# Patient Record
Sex: Male | Born: 1975 | Race: Black or African American | Hispanic: No | Smoking: Current every day smoker
Health system: Southern US, Community
[De-identification: ages and names within clinical notes are randomized; demographics above are authoritative.]

---

## 2017-02-15 ENCOUNTER — Emergency Department (HOSPITAL_COMMUNITY): Payer: Self-pay

## 2017-02-15 ENCOUNTER — Encounter (HOSPITAL_COMMUNITY): Payer: Self-pay | Admitting: Nurse Practitioner

## 2017-02-15 ENCOUNTER — Emergency Department (HOSPITAL_COMMUNITY)
Admission: EM | Admit: 2017-02-15 | Discharge: 2017-02-15 | Disposition: A | Discharge status: Home / Self Care | Payer: Self-pay | Attending: Emergency Medicine | Admitting: Emergency Medicine

## 2017-02-15 ENCOUNTER — Other Ambulatory Visit: Payer: Self-pay

## 2017-02-15 DIAGNOSIS — F17200 Nicotine dependence, unspecified, uncomplicated: Secondary | ICD-10-CM | POA: Insufficient documentation

## 2017-02-15 DIAGNOSIS — K029 Dental caries, unspecified: Secondary | ICD-10-CM | POA: Insufficient documentation

## 2017-02-15 DIAGNOSIS — R072 Precordial pain: Secondary | ICD-10-CM | POA: Insufficient documentation

## 2017-02-15 DIAGNOSIS — R05 Cough: Secondary | ICD-10-CM | POA: Insufficient documentation

## 2017-02-15 DIAGNOSIS — R55 Syncope and collapse: Secondary | ICD-10-CM | POA: Insufficient documentation

## 2017-02-15 DIAGNOSIS — R059 Cough, unspecified: Secondary | ICD-10-CM

## 2017-02-15 DIAGNOSIS — Z7982 Long term (current) use of aspirin: Secondary | ICD-10-CM | POA: Insufficient documentation

## 2017-02-15 LAB — COMPREHENSIVE METABOLIC PANEL
ALT: 52 U/L (ref 17–63)
ANION GAP: 7 (ref 5–15)
AST: 43 U/L — ABNORMAL HIGH (ref 15–41)
Albumin: 3.8 g/dL (ref 3.5–5.0)
Alkaline Phosphatase: 76 U/L (ref 38–126)
BUN: 18 mg/dL (ref 6–20)
CHLORIDE: 108 mmol/L (ref 101–111)
CO2: 25 mmol/L (ref 22–32)
Calcium: 8.9 mg/dL (ref 8.9–10.3)
Creatinine, Ser: 1.12 mg/dL (ref 0.61–1.24)
GFR calc Af Amer: >60 mL/min (ref >60)
GFR calc non Af Amer: >60 mL/min (ref >60)
Glucose, Bld: 96 mg/dL (ref 65–99)
POTASSIUM: 3.8 mmol/L (ref 3.5–5.1)
SODIUM: 140 mmol/L (ref 135–145)
Total Bilirubin: 0.3 mg/dL (ref 0.3–1.2)
Total Protein: 6.6 g/dL (ref 6.5–8.1)

## 2017-02-15 LAB — URINALYSIS, ROUTINE W REFLEX MICROSCOPIC
Bilirubin Urine: NEGATIVE
Glucose, UA: NEGATIVE mg/dL
Hgb urine dipstick: NEGATIVE
KETONES UR: NEGATIVE mg/dL
LEUKOCYTES UA: NEGATIVE
Nitrite: NEGATIVE
PH: 7 (ref 5.0–8.0)
Protein, ur: NEGATIVE mg/dL
SPECIFIC GRAVITY, URINE: 1.004 — AB (ref 1.005–1.030)

## 2017-02-15 LAB — CBC WITH DIFFERENTIAL/PLATELET
BASOS PCT: 0 %
Basophils Absolute: 0 10*3/uL (ref 0.0–0.1)
EOS ABS: 0.1 10*3/uL (ref 0.0–0.7)
EOS PCT: 1 %
HCT: 38.3 % — ABNORMAL LOW (ref 39.0–52.0)
Hemoglobin: 12.7 g/dL — ABNORMAL LOW (ref 13.0–17.0)
Lymphocytes Relative: 38 %
Lymphs Abs: 2.8 10*3/uL (ref 0.7–4.0)
MCH: 32.2 pg (ref 26.0–34.0)
MCHC: 33.2 g/dL (ref 30.0–36.0)
MCV: 97 fL (ref 78.0–100.0)
MONOS PCT: 7 %
Monocytes Absolute: 0.6 10*3/uL (ref 0.1–1.0)
Neutro Abs: 4 10*3/uL (ref 1.7–7.7)
Neutrophils Relative %: 53 %
PLATELETS: 95 10*3/uL — AB (ref 150–400)
RBC: 3.95 MIL/uL — ABNORMAL LOW (ref 4.22–5.81)
RDW: 14 % (ref 11.5–15.5)
WBC: 7.5 10*3/uL (ref 4.0–10.5)

## 2017-02-15 LAB — POCT I-STAT TROPONIN I: Troponin i, poc: 0 ng/mL (ref 0.00–0.08)

## 2017-02-15 MED ORDER — BUPIVACAINE-EPINEPHRINE (PF) 0.5% -1:200000 IJ SOLN
1.8000 mL | Freq: Once | INTRAMUSCULAR | Status: DC
  Filled 2017-02-15: qty 1.8

## 2017-02-15 MED ORDER — PENICILLIN V POTASSIUM 500 MG PO TABS: 1000.0000 mg | ORAL_TABLET | Freq: Two times a day (BID) | ORAL | 0 refills | Status: DC

## 2017-02-15 MED ORDER — SODIUM CHLORIDE 0.9 % IV BOLUS (SEPSIS)
1000.0000 mL | Freq: Once | INTRAVENOUS | Status: AC
  Administered 2017-02-15: 1000 mL via INTRAVENOUS

## 2017-02-15 MED ORDER — IBUPROFEN 800 MG PO TABS: 800.0000 mg | ORAL_TABLET | Freq: Three times a day (TID) | ORAL | 0 refills | Status: DC

## 2017-02-15 NOTE — Discharge Instructions (Signed)
1. Medications: ibuprofen, penicillin, usual home medications °2. Treatment: rest, drink plenty of fluids, take medications as prescribed °3. Follow Up: Please followup with dentistry within 1 week for discussion of your diagnoses and further evaluation after today's visit; if you do not have a primary care doctor use the resource guide provided to find one; Return to the ER for high fevers, difficulty breathing, difficulty swallowing or other concerning symptoms ° °

## 2017-02-15 NOTE — ED Provider Notes (Signed)
[facility]-EMERGENCY DEPT Provider Note   CSN: [id number] Arrival date & time: 02/15/17  0226     History   Chief Complaint Chief Complaint  Patient presents with  . Cough  . Dental Pain    HPI Willie Walter is a 41 y.o. male with a hx of chronic dental pain presents to the Emergency Department with multiple complaints.    Pt reports pacing in the kitchen last night and experienced an LOC.  Reports some prodrome. He reports concern of dehydration due to decreased PO intake.  He does not think he hit his head. NO numbness, tingling, weakness, gait disturbance afterwards.    Pt c/o chronic dental pain and no dental care.  Pt reports pain in the bottom left molar (present for years) and left upper molar.  Pt reports generalized throbbing headache due to dental pain.  He reports loss of sleep.  He has used tylenol and Orajel without relief.  He reports pressure in his frontal sinuses.    Pt also with cough productive of grey sputum and with associated chest pain during coughing.  Denies fever, wheezing, SOB.  Pt reports he is an every day smoker.     HPI  History reviewed. No pertinent past medical history.  There are no active problems to display for this patient.   History reviewed. No pertinent surgical history.     Home Medications    Prior to Admission medications   Medication Sig Start Date End Date Taking? Authorizing Provider  acetaminophen (TYLENOL) 650 MG CR tablet Take 1,950 mg by mouth every 8 (eight) hours as needed for pain.   Yes [provider]  benzocaine (ORAJEL) 10 % mucosal gel Use as directed 1 application in the mouth or throat 4 (four) times daily as needed (for tooth pain).   Yes [provider]  ibuprofen (ADVIL,MOTRIN) 800 MG tablet Take 1 tablet (800 mg total) by mouth 3 (three) times daily. 02/15/17   Christipher Rieger, Dahlia Client, PA-C  penicillin v potassium (VEETID) 500 MG tablet Take 2 tablets (1,000 mg total) by mouth 2 (two) times daily. X 7  days 02/15/17   Tarris Delbene, Dahlia Client, PA-C    Family History History reviewed. No pertinent family history.  Social History Social History  Substance Use Topics  . Smoking status: Current Every Day Smoker  . Smokeless tobacco: Never Used  . Alcohol use Yes     Allergies   Patient has no known allergies.   Review of Systems Review of Systems  Constitutional: Negative for appetite change, diaphoresis, fatigue, fever and unexpected weight change.  HENT: Positive for dental problem. Negative for facial swelling and mouth sores.   Eyes: Negative for visual disturbance.  Respiratory: Negative for cough, chest tightness, shortness of breath and wheezing.   Cardiovascular: Negative for chest pain.  Gastrointestinal: Negative for abdominal pain, constipation, diarrhea, nausea and vomiting.  Endocrine: Negative for polydipsia, polyphagia and polyuria.  Genitourinary: Negative for dysuria, frequency, hematuria and urgency.  Musculoskeletal: Negative for back pain and neck stiffness.  Skin: Negative for rash.  Allergic/Immunologic: Negative for immunocompromised state.  Neurological: Positive for syncope. Negative for light-headedness and headaches.  Hematological: Does not bruise/bleed easily.  Psychiatric/Behavioral: Negative for sleep disturbance. The patient is not nervous/anxious.      Physical Exam Updated Vital Signs BP (!) 131/94 (BP Location: Right Arm)   Pulse 61   Temp 98.5 F (36.9 C) (Oral)   Resp 12   SpO2 97%   Physical Exam  Constitutional: He is  oriented to person, place, and time. He appears well-developed and well-nourished. No distress.  HENT:  Head: Normocephalic and atraumatic.  Right Ear: Tympanic membrane, external ear and ear canal normal.  Left Ear: Tympanic membrane, external ear and ear canal normal.  Nose: Nose normal. Right sinus exhibits no maxillary sinus tenderness and no frontal sinus tenderness. Left sinus exhibits no maxillary sinus  tenderness and no frontal sinus tenderness.  Mouth/Throat: Uvula is midline, oropharynx is clear and moist and mucous membranes are normal. No oral lesions. Abnormal dentition. Dental caries present. No uvula swelling or lacerations. No oropharyngeal exudate, posterior oropharyngeal edema, posterior oropharyngeal erythema or tonsillar abscesses.  Large dental caries to the right upper second molar and left lower first molar No gross abscess No fluctuance or induration to the buccal mucosa or floor of the mouth  Eyes: Conjunctivae and EOM are normal. Pupils are equal, round, and reactive to light. Right eye exhibits no discharge. Left eye exhibits no discharge. No scleral icterus.  No horizontal, vertical or rotational nystagmus  Neck: Normal range of motion. Neck supple.  No stridor Handling secretions without difficulty No nuchal rigidity No cervical lymphadenopathy  Cardiovascular: Normal rate, regular rhythm, normal heart sounds and intact distal pulses.   Pulmonary/Chest: Effort normal and breath sounds normal. No respiratory distress. He has no wheezes. He has no rales.  Equal chest rise  Abdominal: Soft. Bowel sounds are normal. He exhibits no distension. There is no tenderness. There is no rebound and no guarding.  Musculoskeletal: Normal range of motion.  Lymphadenopathy:    He has no cervical adenopathy.  Neurological: He is alert and oriented to person, place, and time. No cranial nerve deficit. He exhibits normal muscle tone. Coordination normal.  Mental Status:  Alert, oriented, thought content appropriate. Speech fluent without evidence of aphasia. Able to follow 2 step commands without difficulty.  Cranial Nerves:  II:  Peripheral visual fields grossly normal, pupils equal, round, reactive to light III,IV, VI: ptosis not present, extra-ocular motions intact bilaterally  V,VII: smile symmetric, facial light touch sensation equal VIII: hearing grossly normal bilaterally  IX,X:  midline uvula rise  XI: bilateral shoulder shrug equal and strong XII: midline tongue extension  Motor:  5/5 in upper and lower extremities bilaterally including strong and equal grip strength and dorsiflexion/plantar flexion Sensory: Pinprick and light touch normal in all extremities.  Cerebellar: normal finger-to-nose with bilateral upper extremities Gait: normal gait and balance CV: distal pulses palpable throughout   Skin: Skin is warm and dry. No rash noted. He is not diaphoretic.  Psychiatric: He has a normal mood and affect. His behavior is normal. Judgment and thought content normal.  Nursing note and vitals reviewed.    ED Treatments / Results  Labs (all labs ordered are listed, but only abnormal results are displayed) Labs Reviewed  CBC WITH DIFFERENTIAL/PLATELET - Abnormal; Notable for the following:       Result Value   RBC 3.95 (*)    Hemoglobin 12.7 (*)    HCT 38.3 (*)    Platelets 95 (*)    All other components within normal limits  COMPREHENSIVE METABOLIC PANEL - Abnormal; Notable for the following:    AST 43 (*)    All other components within normal limits  URINALYSIS, ROUTINE W REFLEX MICROSCOPIC - Abnormal; Notable for the following:    Color, Urine COLORLESS (*)    Specific Gravity, Urine 1.004 (*)    All other components within normal limits  I-STAT TROPOININ, ED  POCT I-STAT TROPONIN I    EKG  EKG Interpretation  Date/Time:  Saturday February 15 2017 05:12:36 EDT Ventricular Rate:  55 PR Interval:    QRS Duration: 85 QT Interval:  441 QTC Calculation: 422 R Axis:   89 Text Interpretation:  Sinus rhythm ST elev, probable normal early repol pattern No acute changes No old tracing to compare Confirmed by Derwood Kaplan [phone]) on 02/15/2017 7:04:58 AM       Radiology Dg Chest 2 View  Result Date: 02/15/2017 CLINICAL DATA:  41 year old male with cough and chest pain. EXAM: CHEST  2 VIEW COMPARISON:  None. FINDINGS: The heart size and mediastinal  contours are within normal limits. Both lungs are clear. The visualized skeletal structures are unremarkable. IMPRESSION: No active cardiopulmonary disease. Electronically Signed   By: Elgie Collard M.D.   On: 02/15/2017 03:56   Ct Head Wo Contrast  Result Date: 02/15/2017 CLINICAL DATA:  Severe dental pain, chest pain and cough.  Syncope. EXAM: CT HEAD WITHOUT CONTRAST TECHNIQUE: Contiguous axial images were obtained from the base of the skull through the vertex without intravenous contrast. COMPARISON:  None. FINDINGS: BRAIN: No intraparenchymal hemorrhage, mass effect nor midline shift. The ventricles and sulci are normal. No acute large vascular territory infarcts. No abnormal extra-axial fluid collections. Basal cisterns are patent. VASCULAR: Unremarkable. SKULL/SOFT TISSUES: No skull fracture. No significant soft tissue swelling. ORBITS/SINUSES: The included ocular globes and orbital contents are nonsuspicious. Old LEFT medial orbital blowout fracture.Mild paranasal sinus mucosal thickening. Soft tissue within the external auditory canals compatible with cerumen. OTHER: None. IMPRESSION: Negative noncontrast CT HEAD. Electronically Signed   By: Awilda Metro M.D.   On: 02/15/2017 05:01    Procedures Dental Block Date/Time: 02/15/2017 6:48 AM Performed by: Dierdre Forth Authorized by: Dierdre Forth   Consent:    Consent obtained:  Verbal   Consent given by:  Patient   Risks discussed:  Infection, hematoma, intravascular injection and pain   Alternatives discussed:  No treatment Indications:    Indications: dental pain   Location:    Block type:  Supraperiosteal   Supraperiosteal location:  Upper teeth and lower teeth   Upper teeth location:  2/RU 2nd molar   Lower teeth location:  19/LL 1st molar Procedure details (see MAR for exact dosages):    Syringe type:  Aspirating dental syringe   Needle gauge:  27 G   Anesthetic injected:  Bupivacaine 0.5% WITH epi    Injection procedure:  Anatomic landmarks identified, introduced needle, incremental injection, anatomic landmarks palpated and negative aspiration for blood Post-procedure details:    Outcome:  Anesthesia achieved   Patient tolerance of procedure:  Tolerated well, no immediate complications   (including critical care time)  Medications Ordered in ED Medications  bupivacaine-epinephrine (MARCAINE W/ EPI) 0.5% -1:200000 injection 1.8 mL (not administered)  bupivacaine-epinephrine (MARCAINE W/ EPI) 0.5% -1:200000 injection 1.8 mL (not administered)  sodium chloride 0.9 % bolus 1,000 mL (1,000 mLs Intravenous New Bag/Given 02/15/17 0504)     Initial Impression / Assessment and Plan / ED Course  I have reviewed the triage vital signs and the nursing notes.  Pertinent labs & imaging results that were available during my care of the patient were reviewed by me and considered in my medical decision making (see chart for details).     Patient presents with persistent dental pain.  No gross abscess.  Exam unconcerning for Ludwig's angina or spread of infection.  Will treat with penicillin and anti-inflammatory medicine.  Digital block given with total pain relief. Urged patient to follow-up with dentist.  Patient given dental resources.  Patient also with complaints of syncopal episode. No evidence of orthostasis here. Mild anemia patient without chest pain or shortness of breath. CT head without acute abnormality. Negative troponin.  Patient with complaints of chest pain with cough. Lungs are clear and equal. Chest x-rays without evidence of pneumonia. Patient is afebrile. Troponin is negative and EKG is reassuring.  Patient does not have a persistent or constant chest pain. No diaphoresis, nausea or vomiting.      Final Clinical Impressions(s) / ED Diagnoses   Final diagnoses:  Pain due to dental caries  Syncope, unspecified syncope type  Cough  Precordial pain    New  Prescriptions New Prescriptions   IBUPROFEN (ADVIL,MOTRIN) 800 MG TABLET    Take 1 tablet (800 mg total) by mouth 3 (three) times daily.   PENICILLIN V POTASSIUM (VEETID) 500 MG TABLET    Take 2 tablets (1,000 mg total) by mouth 2 (two) times daily. X 7 days     Milta DeitersMuthersbaugh, Deriana Vanderhoef, PA-C 02/15/17 [id number]    Ara Grandmaison, Dahlia ClientHannah, PA-C 02/15/17 [id number]    Derwood KaplanNanavati, Ankit, MD 02/15/17 [id number]

## 2017-02-15 NOTE — ED Triage Notes (Signed)
Pt is c/o severe dental pain bilateral jaws, chest pain and also coughing.

## 2017-02-20 ENCOUNTER — Emergency Department (HOSPITAL_COMMUNITY): Admission: EM | Admit: 2017-02-20 | Discharge: 2017-02-20 | Discharge status: Absent without leave | Payer: Self-pay

## 2017-06-24 ENCOUNTER — Encounter (HOSPITAL_COMMUNITY): Payer: Self-pay | Admitting: *Deleted

## 2017-06-24 ENCOUNTER — Emergency Department (HOSPITAL_COMMUNITY)
Admission: EM | Admit: 2017-06-24 | Discharge: 2017-06-24 | Disposition: A | Discharge status: Home / Self Care | Payer: Self-pay | Attending: Emergency Medicine | Admitting: Emergency Medicine

## 2017-06-24 DIAGNOSIS — K0889 Other specified disorders of teeth and supporting structures: Secondary | ICD-10-CM | POA: Insufficient documentation

## 2017-06-24 DIAGNOSIS — F17200 Nicotine dependence, unspecified, uncomplicated: Secondary | ICD-10-CM | POA: Insufficient documentation

## 2017-06-24 DIAGNOSIS — Z791 Long term (current) use of non-steroidal anti-inflammatories (NSAID): Secondary | ICD-10-CM | POA: Insufficient documentation

## 2017-06-24 DIAGNOSIS — Z202 Contact with and (suspected) exposure to infections with a predominantly sexual mode of transmission: Secondary | ICD-10-CM | POA: Insufficient documentation

## 2017-06-24 LAB — URINALYSIS, ROUTINE W REFLEX MICROSCOPIC
BILIRUBIN URINE: NEGATIVE
GLUCOSE, UA: NEGATIVE mg/dL
HGB URINE DIPSTICK: NEGATIVE
Ketones, ur: NEGATIVE mg/dL
Leukocytes, UA: NEGATIVE
Nitrite: NEGATIVE
Protein, ur: NEGATIVE mg/dL
SPECIFIC GRAVITY, URINE: 1.002 — AB (ref 1.005–1.030)
pH: 6 (ref 5.0–8.0)

## 2017-06-24 MED ORDER — PENICILLIN V POTASSIUM 500 MG PO TABS: 500.0000 mg | ORAL_TABLET | Freq: Four times a day (QID) | ORAL | 0 refills | Status: AC

## 2017-06-24 MED ORDER — AZITHROMYCIN 250 MG PO TABS
1000.0000 mg | ORAL_TABLET | Freq: Once | ORAL | Status: AC
  Administered 2017-06-24: 1000 mg via ORAL
  Filled 2017-06-24: qty 4

## 2017-06-24 MED ORDER — ONDANSETRON 4 MG PO TBDP
8.0000 mg | ORAL_TABLET | Freq: Once | ORAL | Status: AC
  Administered 2017-06-24: 8 mg via ORAL
  Filled 2017-06-24: qty 2

## 2017-06-24 MED ORDER — LIDOCAINE HCL (PF) 1 % IJ SOLN
INTRAMUSCULAR | Status: AC
  Filled 2017-06-24: qty 5

## 2017-06-24 MED ORDER — CEFTRIAXONE SODIUM 250 MG IJ SOLR
250.0000 mg | Freq: Once | INTRAMUSCULAR | Status: AC
  Administered 2017-06-24: 250 mg via INTRAMUSCULAR
  Filled 2017-06-24: qty 250

## 2017-06-24 NOTE — ED Triage Notes (Signed)
Pt c/o dysuria and penile discharge onset x 2 days, pt here for dental pain 10/10 onset x 1 mth worsening recently, pt has multiple cavities, c/o pain bil lower teeth, A&O x4

## 2017-06-24 NOTE — Discharge Instructions (Signed)
Please complete full course of antibiotics and use the resources provided to find a dentist. If you develop worsening facial swelling, trouble swallowing fevers or chills or other concerning symptoms please return to the emergency department for sooner evaluation.   Today you were treated for gonorrhea and Chlamydia. Please inform any partners. Continue to use condoms for protection.

## 2017-06-24 NOTE — ED Provider Notes (Signed)
[facility]   CSN: [id number] Arrival date & time: 06/24/17  [id number]     History   Chief Complaint Chief Complaint  Patient presents with  . Dental Pain  . Exposure to STD    HPI Willie Walter is a 41 y.o. male.  HPI Patient presents complaining of dental pain, as well as dysuria and penile discharge for 2 days. Patient reports pain around the bottom left molars, he reports he's had issues with this tooth for several months, but over the past 5 days pain has gotten worse, and feels similar to when he had previous infection. Patient reports minimal swelling on the left side of the face, which has improved with ibuprofen, ibuprofen is also helped his pain. Patient is trying to get into see a dentist, but has had issues due to cost, but reports he is planning to have the tooth pulled as soon as possible. Patient denies any swelling of the tongue, pain under the tongue, or difficulty breathing, no fevers no nausea or vomiting. Pt has been able to eat and drink without issues.  Patient also reports an exposure to chalmydia a few weeks ago, patient reports a few days ago he was having some penile discharge and burning with urination, which has since stopped, patient denies any abdominal pain, no scrotal or testicular tenderness, no pain with bowel movements. Reports he typically uses condoms, but they broke during this encounter.  History reviewed. No pertinent past medical history.  There are no active problems to display for this patient.   History reviewed. No pertinent surgical history.     Home Medications    Prior to Admission medications   Medication Sig Start Date End Date Taking? Authorizing Provider  acetaminophen (TYLENOL) 650 MG CR tablet Take 1,950 mg by mouth every 8 (eight) hours as needed for pain.    [provider]  benzocaine (ORAJEL) 10 % mucosal gel Use as directed 1 application in the mouth or throat 4  (four) times daily as needed (for tooth pain).    [provider]  ibuprofen (ADVIL,MOTRIN) 800 MG tablet Take 1 tablet (800 mg total) by mouth 3 (three) times daily. 02/15/17   Muthersbaugh, Dahlia ClientHannah, PA-C  penicillin v potassium (VEETID) 500 MG tablet Take 2 tablets (1,000 mg total) by mouth 2 (two) times daily. X 7 days 02/15/17   Muthersbaugh, Dahlia ClientHannah, PA-C    Family History No family history on file.  Social History Social History  Substance Use Topics  . Smoking status: Current Every Day Smoker    Packs/day: 0.50  . Smokeless tobacco: Never Used  . Alcohol use Yes     Allergies   Patient has no known allergies.   Review of Systems Review of Systems  Constitutional: Negative for chills and fever.  HENT: Positive for dental problem and facial swelling. Negative for drooling, ear pain, mouth sores, rhinorrhea, sore throat, trouble swallowing and voice change.   Eyes: Negative for pain and visual disturbance.  Respiratory: Negative for chest tightness and shortness of breath.   Gastrointestinal: Negative for abdominal pain, nausea, rectal pain and vomiting.  Genitourinary: Positive for discharge and dysuria. Negative for flank pain, hematuria, penile pain, penile swelling, scrotal swelling and testicular pain.  Skin: Negative for color change and rash.  Neurological: Negative for headaches.     Physical Exam Updated Vital Signs BP 135/81 (BP Location: Left Arm)   Pulse 88   Temp 97.9 F (36.6 C) (Oral)  Resp 18   Ht 6' (1.829 m)   Wt 88.5 kg (195 lb)   SpO2 99%   BMI 26.45 kg/m   Physical Exam  Constitutional: He appears well-developed and well-nourished. No distress.  HENT:  Head: Normocephalic and atraumatic.  No facial swelling appreciated. Several teeth in poor dentition, no obvious abscess noted, no gingival erythema, no sublingual tenderness or induration, no trismus or torticollis  Eyes: Pupils are equal, round, and reactive to light. EOM are normal.  Right eye exhibits no discharge. Left eye exhibits no discharge.  No pain with EOMs  Neck: Neck supple.  Pulmonary/Chest: Effort normal. No respiratory distress.  Abdominal: Soft. Bowel sounds are normal. He exhibits no distension. There is no tenderness. There is no guarding.  Genitourinary: Testes normal and penis normal. No penile tenderness. No discharge found.  Genitourinary Comments: Testes and scrotum NTTP, no erythema or swelling  Neurological: He is alert. Coordination normal.  Skin: Skin is warm and dry. He is not diaphoretic.  Psychiatric: He has a normal mood and affect. His behavior is normal.  Nursing note and vitals reviewed.    ED Treatments / Results  Labs (all labs ordered are listed, but only abnormal results are displayed) Labs Reviewed  URINALYSIS, ROUTINE W REFLEX MICROSCOPIC - Abnormal; Notable for the following:       Result Value   Color, Urine COLORLESS (*)    Specific Gravity, Urine 1.002 (*)    All other components within normal limits  GC/CHLAMYDIA PROBE AMP ([place]) NOT AT [facility]    EKG  EKG Interpretation None       Radiology No results found.  Procedures Procedures (including critical care time)  Medications Ordered in ED Medications  cefTRIAXone (ROCEPHIN) injection 250 mg (250 mg Intramuscular Given 06/24/17 1015)  azithromycin (ZITHROMAX) tablet 1,000 mg (1,000 mg Oral Given 06/24/17 1018)  ondansetron (ZOFRAN-ODT) disintegrating tablet 8 mg (8 mg Oral Given 06/24/17 1015)     Initial Impression / Assessment and Plan / ED Course  I have reviewed the triage vital signs and the nursing notes.  Pertinent labs & imaging results that were available during my care of the patient were reviewed by me and considered in my medical decision making (see chart for details).  Patient with toothache.  No gross abscess.  Exam unconcerning for Ludwig's angina or spread of infection.  Will treat with penicillin and anti-inflammatories medicine.   Urged patient to follow-up with dentist. Dental resources provided.  Patient is afebrile without abdominal tenderness, abdominal pain or painful bowel movements to indicate prostatitis.  No tenderness to palpation of the testes or epididymis to suggest orchitis or epididymitis.  STD cultures obtained including gonorrhea and chlamydia. Pt deferred syphilis and HIV testing. Patient to be discharged with instructions to follow up with PCP. Discussed importance of using protection when sexually active. Pt understands that they have GC/Chlamydia cultures pending and that they will need to inform all sexual partners if results return positive. Patient has been treated prophylactically with azithromycin and Rocephin.    Final Clinical Impressions(s) / ED Diagnoses   Final diagnoses:  Pain, dental  STD exposure    New Prescriptions Discharge Medication List as of 06/24/2017 10:15 AM    START taking these medications   Details  !! penicillin v potassium (VEETID) 500 MG tablet Take 1 tablet (500 mg total) by mouth 4 (four) times daily., Starting Tue 06/24/2017, Until Tue 07/01/2017, Print     !! - Potential duplicate medications found. Please  discuss with provider.       Dartha Lodge, PA-C 06/24/17 2039    Tegeler, Canary Brim, MD 06/25/17 [phone]

## 2017-06-25 LAB — GC/CHLAMYDIA PROBE AMP (~~LOC~~) NOT AT ARMC
Chlamydia: NEGATIVE
Neisseria Gonorrhea: POSITIVE — AB

## 2018-04-29 ENCOUNTER — Ambulatory Visit (HOSPITAL_COMMUNITY)
Admission: EM | Admit: 2018-04-29 | Discharge: 2018-04-29 | Disposition: A | Discharge status: Home / Self Care | Payer: 59 | Attending: Family Medicine | Admitting: Family Medicine

## 2018-04-29 ENCOUNTER — Encounter (HOSPITAL_COMMUNITY): Payer: Self-pay | Admitting: Emergency Medicine

## 2018-04-29 DIAGNOSIS — B9789 Other viral agents as the cause of diseases classified elsewhere: Secondary | ICD-10-CM

## 2018-04-29 DIAGNOSIS — K0889 Other specified disorders of teeth and supporting structures: Secondary | ICD-10-CM | POA: Diagnosis not present

## 2018-04-29 DIAGNOSIS — J069 Acute upper respiratory infection, unspecified: Secondary | ICD-10-CM

## 2018-04-29 MED ORDER — AMOXICILLIN-POT CLAVULANATE 875-125 MG PO TABS: 1.0000 | ORAL_TABLET | Freq: Two times a day (BID) | ORAL | 0 refills | Status: AC

## 2018-04-29 MED ORDER — BENZONATATE 100 MG PO CAPS: 100.0000 mg | ORAL_CAPSULE | Freq: Three times a day (TID) | ORAL | 0 refills | Status: DC

## 2018-04-29 MED ORDER — PROMETHAZINE-DM 6.25-15 MG/5ML PO SYRP: 5.0000 mL | ORAL_SOLUTION | Freq: Four times a day (QID) | ORAL | 0 refills | Status: DC | PRN

## 2018-04-29 MED ORDER — IBUPROFEN 800 MG PO TABS: 800.0000 mg | ORAL_TABLET | Freq: Three times a day (TID) | ORAL | 0 refills | Status: DC

## 2018-04-29 NOTE — Discharge Instructions (Signed)
Push fluids to ensure adequate hydration and keep secretions thin.  Tylenol and/or ibuprofen as needed for pain or fevers.   Tessalon as needed for cough. Cough syrup 4 times a day as needed. May cause drowsiness. Please do not take if driving or drinking alcohol.   Complete course of antibiotics for dental pain.  Please follow up with dentist for definitive treatment.

## 2018-04-29 NOTE — ED Triage Notes (Signed)
Pt sts cough and pain with cough and dental pain; pt sts not feeling well x several days

## 2018-04-29 NOTE — ED Provider Notes (Signed)
[facility]    CSN: [id number] Arrival date & time: 04/29/18  0957     History   Chief Complaint Chief Complaint  Patient presents with  . Cough  . Dental Pain    HPI Willie Walter is a 42 y.o. male.   Willie Walter presents with complaints of cough for the past 3-4 days, worse at night causing poor sleep. Congestion. No shortness of breath. No sore throat. No fevers. No ear pain. States he feels his family members may have had some URI symptoms. No gi/gu complaints. States history of bronchitis, no known asthma history. Occasionally smokes. Has not taken any medications for symptoms. Also with worsening of left lower molar pain which. Has had issues intermittently for months. Worse over the past two weeks. No drainage or swelling. Pain 10/10. States recently obtained insurance so hopes to get in with a dentist soon.     ROS per HPI.      History reviewed. No pertinent past medical history.  There are no active problems to display for this patient.   History reviewed. No pertinent surgical history.     Home Medications    Prior to Admission medications   Medication Sig Start Date End Date Taking? Authorizing Provider  amoxicillin-clavulanate (AUGMENTIN) 875-125 MG tablet Take 1 tablet by mouth every 12 (twelve) hours for 7 days. 04/29/18 05/06/18  Georgetta Haber, NP  benzonatate (TESSALON) 100 MG capsule Take 1 capsule (100 mg total) by mouth every 8 (eight) hours. 04/29/18   Georgetta Haber, NP  ibuprofen (ADVIL,MOTRIN) 800 MG tablet Take 1 tablet (800 mg total) by mouth 3 (three) times daily. 04/29/18   Georgetta Haber, NP  promethazine-dextromethorphan (PROMETHAZINE-DM) 6.25-15 MG/5ML syrup Take 5 mLs by mouth 4 (four) times daily as needed for cough. 04/29/18   Georgetta Haber, NP    Family History History reviewed. No pertinent family history.  Social History Social History   Tobacco Use  . Smoking status: Current Every Day Smoker    Packs/day: 0.50  .  Smokeless tobacco: Never Used  Substance Use Topics  . Alcohol use: Yes  . Drug use: No     Allergies   Patient has no known allergies.   Review of Systems Review of Systems   Physical Exam Triage Vital Signs ED Triage Vitals [04/29/18 1033]  Enc Vitals Group     BP 128/79     Pulse Rate 72     Resp 16     Temp 98.3 F (36.8 C)     Temp Source Oral     SpO2 100 %     Weight      Height      Head Circumference      Peak Flow      Pain Score      Pain Loc      Pain Edu?      Excl. in [place]?    No data found.  Updated Vital Signs BP 128/79 (BP Location: Left Arm)   Pulse 72   Temp 98.3 F (36.8 C) (Oral)   Resp 16   SpO2 100%    Physical Exam  Constitutional: He is oriented to person, place, and time. He appears well-developed and well-nourished.  HENT:  Head: Normocephalic and atraumatic.  Right Ear: Tympanic membrane, external ear and ear canal normal.  Left Ear: Tympanic membrane, external ear and ear canal normal.  Nose: Rhinorrhea present. Right sinus exhibits no maxillary sinus tenderness and no frontal  sinus tenderness. Left sinus exhibits no maxillary sinus tenderness and no frontal sinus tenderness.  Mouth/Throat: Uvula is midline, oropharynx is clear and moist and mucous membranes are normal. No trismus in the jaw. Dental caries present. No uvula swelling.  Large caries and hole to tooth #19; scattered small caries throughout  Eyes: Pupils are equal, round, and reactive to light. Conjunctivae are normal.  Neck: Normal range of motion.  Cardiovascular: Normal rate and regular rhythm.  Pulmonary/Chest: Effort normal and breath sounds normal.  Lymphadenopathy:    He has no cervical adenopathy.  Neurological: He is alert and oriented to person, place, and time.  Skin: Skin is warm and dry.  Vitals reviewed.    UC Treatments / Results  Labs (all labs ordered are listed, but only abnormal results are displayed) Labs Reviewed - No data to  display  EKG None  Radiology No results found.  Procedures Procedures (including critical care time)  Medications Ordered in UC Medications - No data to display  Initial Impression / Assessment and Plan / UC Course  I have reviewed the triage vital signs and the nursing notes.  Pertinent labs & imaging results that were available during my care of the patient were reviewed by me and considered in my medical decision making (see chart for details).     Course of augmentin provided to help with dental and if any bacterial component to URI. Cough syrup provided to patient with drowsy precautions provided. Ibuprofen for pain. Follow up with dentist for definitive treatment. Patient verbalized understanding and agreeable to plan.    Final Clinical Impressions(s) / UC Diagnoses   Final diagnoses:  Pain, dental  Viral URI with cough     Discharge Instructions     Push fluids to ensure adequate hydration and keep secretions thin.  Tylenol and/or ibuprofen as needed for pain or fevers.   Tessalon as needed for cough. Cough syrup 4 times a day as needed. May cause drowsiness. Please do not take if driving or drinking alcohol.   Complete course of antibiotics for dental pain.  Please follow up with dentist for definitive treatment.    ED Prescriptions    Medication Sig Dispense Auth. Provider   ibuprofen (ADVIL,MOTRIN) 800 MG tablet Take 1 tablet (800 mg total) by mouth 3 (three) times daily. 30 tablet Linus Mako B, NP   amoxicillin-clavulanate (AUGMENTIN) 875-125 MG tablet Take 1 tablet by mouth every 12 (twelve) hours for 7 days. 14 tablet Burky, Dorene Grebe B, NP   benzonatate (TESSALON) 100 MG capsule Take 1 capsule (100 mg total) by mouth every 8 (eight) hours. 21 capsule Linus Mako B, NP   promethazine-dextromethorphan (PROMETHAZINE-DM) 6.25-15 MG/5ML syrup Take 5 mLs by mouth 4 (four) times daily as needed for cough. 118 mL Linus Mako B, NP     Controlled Substance  Prescriptions [place] Controlled Substance Registry consulted? Not Applicable   Georgetta Haber, NP 04/29/18 1116

## 2019-04-02 ENCOUNTER — Encounter (HOSPITAL_COMMUNITY): Payer: Self-pay | Admitting: Emergency Medicine

## 2019-04-02 ENCOUNTER — Other Ambulatory Visit: Payer: Self-pay

## 2019-04-02 ENCOUNTER — Ambulatory Visit (HOSPITAL_COMMUNITY)
Admission: EM | Admit: 2019-04-02 | Discharge: 2019-04-02 | Disposition: A | Discharge status: Home / Self Care | Payer: Self-pay | Attending: Family Medicine | Admitting: Family Medicine

## 2019-04-02 ENCOUNTER — Ambulatory Visit (INDEPENDENT_AMBULATORY_CARE_PROVIDER_SITE_OTHER): Payer: Self-pay

## 2019-04-02 DIAGNOSIS — M25561 Pain in right knee: Secondary | ICD-10-CM

## 2019-04-02 MED ORDER — HYDROCODONE-ACETAMINOPHEN 5-325 MG PO TABS: 1.0000 | ORAL_TABLET | Freq: Four times a day (QID) | ORAL | 0 refills | Status: DC | PRN

## 2019-04-02 MED ORDER — IBUPROFEN 800 MG PO TABS: 800.0000 mg | ORAL_TABLET | Freq: Three times a day (TID) | ORAL | 0 refills | Status: DC

## 2019-04-02 MED ORDER — IBUPROFEN 800 MG PO TABS
800.0000 mg | ORAL_TABLET | Freq: Once | ORAL | Status: AC
  Administered 2019-04-02: 14:00:00 800 mg via ORAL

## 2019-04-02 MED ORDER — IBUPROFEN 800 MG PO TABS
ORAL_TABLET | ORAL | Status: AC
  Filled 2019-04-02: qty 1

## 2019-04-02 NOTE — ED Provider Notes (Signed)
[place]    CSN: [id number] Arrival date & time: 04/02/19  1248      History   Chief Complaint Chief Complaint  Patient presents with  . Knee Pain    HPI Willie Walter is a 43 y.o. male no significant past medical history presenting today for evaluation of right knee injury.  Patient states that he was walking out of the grocery store last night, slipped and fell in the rain twisted and landed awkwardly on his right knee.  Since he has had worsening pain, difficulty walking.  Feels as if he "tore up" his knee.  Denies previous injury to knee.  Has a lot of pain with any bending movement of knee.  HPI  History reviewed. No pertinent past medical history.  There are no active problems to display for this patient.   History reviewed. No pertinent surgical history.     Home Medications    Prior to Admission medications   Medication Sig Start Date End Date Taking? Authorizing Provider  HYDROcodone-acetaminophen (NORCO/VICODIN) 5-325 MG tablet Take 1 tablet by mouth every 6 (six) hours as needed for severe pain. 04/02/19   Nena Hampe C, PA-C  ibuprofen (ADVIL) 800 MG tablet Take 1 tablet (800 mg total) by mouth 3 (three) times daily. 04/02/19   Vaeda Westall, Elesa Hacker, PA-C    Family History History reviewed. No pertinent family history.  Social History Social History   Tobacco Use  . Smoking status: Current Every Day Smoker    Packs/day: 0.50  . Smokeless tobacco: Never Used  Substance Use Topics  . Alcohol use: Yes  . Drug use: No     Allergies   Patient has no known allergies.   Review of Systems Review of Systems  Constitutional: Negative for fatigue and fever.  Eyes: Negative for redness, itching and visual disturbance.  Respiratory: Negative for shortness of breath.   Cardiovascular: Negative for chest pain and leg swelling.  Gastrointestinal: Negative for nausea and vomiting.  Musculoskeletal: Positive for arthralgias, gait problem and joint  swelling. Negative for myalgias.  Skin: Positive for color change. Negative for rash and wound.  Neurological: Negative for dizziness, syncope, weakness, light-headedness and headaches.     Physical Exam Triage Vital Signs ED Triage Vitals [04/02/19 1355]  Enc Vitals Group     BP 133/80     Pulse Rate 75     Resp 18     Temp 97.8 F (36.6 C)     Temp Source Oral     SpO2 99 %     Weight      Height      Head Circumference      Peak Flow      Pain Score 8     Pain Loc      Pain Edu?      Excl. in [place]?    No data found.  Updated Vital Signs BP 133/80 (BP Location: Right Arm)   Pulse 75   Temp 97.8 F (36.6 C) (Oral)   Resp 18   SpO2 99%   Visual Acuity Right Eye Distance:   Left Eye Distance:   Bilateral Distance:    Right Eye Near:   Left Eye Near:    Bilateral Near:     Physical Exam Vitals signs and nursing note reviewed.  Constitutional:      Appearance: He is well-developed.     Comments: No acute distress  HENT:     Head: Normocephalic and atraumatic.  Nose: Nose normal.  Eyes:     Conjunctiva/sclera: Conjunctivae normal.  Neck:     Musculoskeletal: Neck supple.  Cardiovascular:     Rate and Rhythm: Normal rate.  Pulmonary:     Effort: Pulmonary effort is normal. No respiratory distress.  Abdominal:     General: There is no distension.  Musculoskeletal: Normal range of motion.     Comments: Right knee: Mild swelling noted, no obvious deformity or abrasions.  Medial aspect appears faintly erythematous, no increase in warmth.  Mild tenderness to palpation over patella, significant tenderness along medial joint line, nontender over lateral joint line, nontender in popliteal area or extending into calf, nontender over patellar tendon and to tibial tubercle  Range of motion slowed, but full No varus or valgus laxity appreciated  Skin:    General: Skin is warm and dry.  Neurological:     Mental Status: He is alert and oriented to person, place,  and time.      UC Treatments / Results  Labs (all labs ordered are listed, but only abnormal results are displayed) Labs Reviewed - No data to display  EKG   Radiology Dg Knee Complete 4 Views Right  Result Date: 04/02/2019 CLINICAL DATA:  Slipped and fell in the rain last night. Right knee pain medially. EXAM: RIGHT KNEE - COMPLETE 4+ VIEW COMPARISON:  None. FINDINGS: No evidence of fracture, dislocation, or joint effusion. No evidence of arthropathy or other focal bone abnormality. Soft tissues are unremarkable. IMPRESSION: Negative. Electronically Signed   By: Sebastian AcheAllen  Grady M.D.   On: 04/02/2019 14:55    Procedures Procedures (including critical care time)  Medications Ordered in UC Medications  ibuprofen (ADVIL) tablet 800 mg (800 mg Oral Given 04/02/19 1428)  ibuprofen (ADVIL) 800 MG tablet (has no administration in time range)    Initial Impression / Assessment and Plan / UC Course  I have reviewed the triage vital signs and the nursing notes.  Pertinent labs & imaging results that were available during my care of the patient were reviewed by me and considered in my medical decision making (see chart for details).     X-ray negative for acute bony abnormality.  Cannot rule out underlying meniscus or ligamentous injury.  Given patient's amount of pain will place on crutches and knee immobilizer.  Anti-inflammatories, ice and elevation.  Did provide patient a few tablets of hydrocodone to use for severe pain.  Advised to use sparingly and do not drive or work after taking.  Follow-up with sports medicine if symptoms persisting for further imaging.Discussed strict return precautions. Patient verbalized understanding and is agreeable with plan.  Final Clinical Impressions(s) / UC Diagnoses   Final diagnoses:  Acute pain of right knee     Discharge Instructions     X-ray negative for fracture Please use crutches temporarily and slowly transition to weightbearing if pain  improving Ice and elevate knee For mild to moderate pain-use anti-inflammatories for pain/swelling. You may take up to 800 mg Ibuprofen every 8 hours with food. You may supplement Ibuprofen with Tylenol [phone] mg every 8 hours.  I provided you with a few tablets of hydrocodone to use for severe pain.  Do not drive or work after taking.  Will cause drowsiness.  Please use sparingly and only for severe pain.  If knee pain not having any improvement with the above in 3 to 5 days please follow-up with sports medicine for further imaging and evaluation of knee for underlying meniscus or ligament  damage.      ED Prescriptions    Medication Sig Dispense Auth. Provider   ibuprofen (ADVIL) 800 MG tablet Take 1 tablet (800 mg total) by mouth 3 (three) times daily. 21 tablet Symantha Steeber C, PA-C   HYDROcodone-acetaminophen (NORCO/VICODIN) 5-325 MG tablet Take 1 tablet by mouth every 6 (six) hours as needed for severe pain. 8 tablet Axzel Rockhill, RoyalHallie C, PA-C     Controlled Substance Prescriptions [place] Controlled Substance Registry consulted? Yes, I have consulted the  Controlled Substances Registry for this patient, and feel the risk/benefit ratio today is favorable for proceeding with this prescription for a controlled substance.   Lew DawesWieters, Kareem Cathey C, PA-C 04/02/19 1659

## 2019-04-02 NOTE — ED Triage Notes (Signed)
Pt here with right knee pain after fall last night; pt sts unable to walk due to pain

## 2019-04-02 NOTE — Discharge Instructions (Signed)
X-ray negative for fracture Please use crutches temporarily and slowly transition to weightbearing if pain improving Ice and elevate knee For mild to moderate pain-use anti-inflammatories for pain/swelling. You may take up to 800 mg Ibuprofen every 8 hours with food. You may supplement Ibuprofen with Tylenol [phone] mg every 8 hours.  I provided you with a few tablets of hydrocodone to use for severe pain.  Do not drive or work after taking.  Will cause drowsiness.  Please use sparingly and only for severe pain.  If knee pain not having any improvement with the above in 3 to 5 days please follow-up with sports medicine for further imaging and evaluation of knee for underlying meniscus or ligament damage.

## 2019-04-05 ENCOUNTER — Ambulatory Visit: Payer: Self-pay | Admitting: Family Medicine

## 2020-01-18 ENCOUNTER — Ambulatory Visit (HOSPITAL_COMMUNITY)
Admission: EM | Admit: 2020-01-18 | Discharge: 2020-01-18 | Disposition: A | Discharge status: Home / Self Care | Payer: Medicaid Other | Attending: Family Medicine | Admitting: Family Medicine

## 2020-01-18 ENCOUNTER — Telehealth (HOSPITAL_COMMUNITY): Payer: Self-pay

## 2020-01-18 ENCOUNTER — Encounter (HOSPITAL_COMMUNITY): Payer: Self-pay

## 2020-01-18 ENCOUNTER — Other Ambulatory Visit: Payer: Self-pay

## 2020-01-18 DIAGNOSIS — K047 Periapical abscess without sinus: Secondary | ICD-10-CM

## 2020-01-18 DIAGNOSIS — K029 Dental caries, unspecified: Secondary | ICD-10-CM

## 2020-01-18 MED ORDER — HYDROCODONE-ACETAMINOPHEN 7.5-325 MG PO TABS: 1.0000 | ORAL_TABLET | Freq: Four times a day (QID) | ORAL | 0 refills | Status: AC | PRN

## 2020-01-18 MED ORDER — HYDROCODONE-ACETAMINOPHEN 5-325 MG PO TABS
2.0000 | ORAL_TABLET | Freq: Once | ORAL | Status: AC
  Administered 2020-01-18: 2 via ORAL

## 2020-01-18 MED ORDER — HYDROCODONE-ACETAMINOPHEN 5-325 MG PO TABS
ORAL_TABLET | ORAL | Status: AC
  Filled 2020-01-18: qty 2

## 2020-01-18 MED ORDER — PENICILLIN V POTASSIUM 500 MG PO TABS: 500.0000 mg | ORAL_TABLET | Freq: Four times a day (QID) | ORAL | 0 refills | Status: AC

## 2020-01-18 NOTE — Telephone Encounter (Signed)
Pt

## 2020-01-18 NOTE — Telephone Encounter (Signed)
Pt phoned and said his pain persists despite the hydrocodone given earlier. Explained to pt that the abx needs time to work/be effective for pain to be more easily managed. Pt stated he wanted a shot of "numbing medication" in his mouth to help with pain. Explained to pt that [facility] med staff does not perform oral local anesthesia/blocks.  Re-iterated that pt needs to cont abx tx, pain meds and see a dentist. Pt verbalized understanding and agreement.

## 2020-01-18 NOTE — ED Triage Notes (Signed)
Pt presents with dental pain on front right side.

## 2020-01-18 NOTE — ED Provider Notes (Signed)
[place]    CSN: [id number] Arrival date & time: 01/18/20  0840      History   Chief Complaint Chief Complaint  Patient presents with  . Dental Pain    HPI Willie Walter is a 44 y.o. male.   HPI  Patient has a known dental fracture and since yesterday he has been having increasing swelling and pain on the right lower jaw.  Patient states that he had a filling in this tooth that fell off.  Now there is a "big hole" tooth.  He states that he has trouble with food being trapped in here.  He does have a dentist who is here in follow-up.  No fever or chills.  He states he does sense a bad taste from the area.  Pain not relieved by ibuprofen.  Patient states he was up all night with pain.  History reviewed. No pertinent past medical history.  There are no problems to display for this patient.   History reviewed. No pertinent surgical history.     Home Medications    Prior to Admission medications   Medication Sig Start Date End Date Taking? Authorizing Provider  HYDROcodone-acetaminophen (NORCO) 7.5-325 MG tablet Take 1 tablet by mouth every 6 (six) hours as needed for moderate pain. 01/18/20   Raylene Everts, MD  penicillin v potassium (VEETID) 500 MG tablet Take 1 tablet (500 mg total) by mouth 4 (four) times daily for 10 days. 01/18/20 01/28/20  Raylene Everts, MD    Family History History reviewed. No pertinent family history.  Social History Social History   Tobacco Use  . Smoking status: Current Every Day Smoker    Packs/day: 0.50  . Smokeless tobacco: Never Used  Substance Use Topics  . Alcohol use: Yes  . Drug use: No     Allergies   Patient has no known allergies.   Review of Systems Review of Systems  HENT: Positive for dental problem.      Physical Exam Triage Vital Signs ED Triage Vitals  Enc Vitals Group     BP 01/18/20 0902 (!) 158/96     Pulse Rate 01/18/20 0902 83     Resp 01/18/20 0902 16     Temp 01/18/20 0902 97.9  F (36.6 C)     Temp Source 01/18/20 0902 Oral     SpO2 01/18/20 0902 100 %     Weight --      Height --      Head Circumference --      Peak Flow --      Pain Score 01/18/20 0907 10     Pain Loc --      Pain Edu? --      Excl. in [place]? --    No data found.  Updated Vital Signs BP (!) 158/96 (BP Location: Left Arm)   Pulse 83   Temp 97.9 F (36.6 C) (Oral)   Resp 16   SpO2 100%       Physical Exam Constitutional:      General: He is not in acute distress.    Appearance: He is well-developed.  HENT:     Head: Normocephalic and atraumatic.     Mouth/Throat:   Eyes:     Conjunctiva/sclera: Conjunctivae normal.     Pupils: Pupils are equal, round, and reactive to light.  Cardiovascular:     Rate and Rhythm: Normal rate.  Pulmonary:     Effort: Pulmonary effort is normal. No respiratory  distress.  Musculoskeletal:        General: Normal range of motion.     Cervical back: Normal range of motion.  Skin:    General: Skin is warm and dry.  Neurological:     Mental Status: He is alert.  Psychiatric:        Mood and Affect: Mood normal.        Behavior: Behavior normal.      UC Treatments / Results  Labs (all labs ordered are listed, but only abnormal results are displayed) Labs Reviewed - No data to display  EKG   Radiology No results found.  Procedures Procedures (including critical care time)  Medications Ordered in UC Medications  HYDROcodone-acetaminophen (NORCO/VICODIN) 5-325 MG per tablet 2 tablet (2 tablets Oral Given 01/18/20 0933)    Initial Impression / Assessment and Plan / UC Course  I have reviewed the triage vital signs and the nursing notes.  Pertinent labs & imaging results that were available during my care of the patient were reviewed by me and considered in my medical decision making (see chart for details).     Reviewed the importance of dental follow-up. Final Clinical Impressions(s) / UC Diagnoses   Final diagnoses:  Dental  caries  Dental infection     Discharge Instructions     Follow-up with a dentist is important Take pain medication as needed Take the penicillin 4 times a day Call or return if not improving in a couple days   ED Prescriptions    Medication Sig Dispense Auth. Provider   penicillin v potassium (VEETID) 500 MG tablet Take 1 tablet (500 mg total) by mouth 4 (four) times daily for 10 days. 40 tablet Eustace Moore, MD   HYDROcodone-acetaminophen [facility]) 7.5-325 MG tablet Take 1 tablet by mouth every 6 (six) hours as needed for moderate pain. 15 tablet Eustace Moore, MD     I have reviewed the PDMP during this encounter.   Eustace Moore, MD 01/18/20 [phone]

## 2020-01-18 NOTE — Discharge Instructions (Signed)
Follow-up with a dentist is important Take pain medication as needed Take the penicillin 4 times a day Call or return if not improving in a couple days

## 2020-04-01 IMAGING — DX RIGHT KNEE - COMPLETE 4+ VIEW
5 series · 5 of 5 positions shown · non-contrast
Comparison: None.

CLINICAL DATA: Slipped and fell in the rain last night. Right knee
pain medially.

EXAM:
RIGHT KNEE - COMPLETE 4+ VIEW

[knee ap]
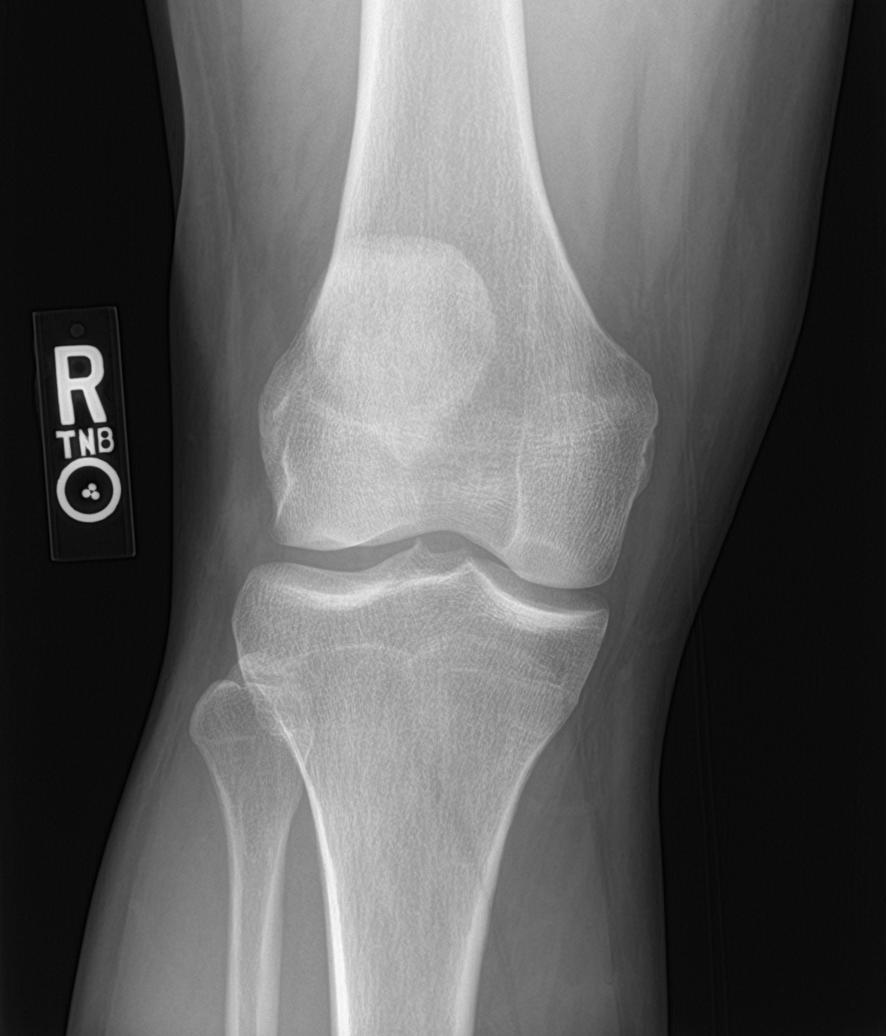

[knee obl (1 of 2)]
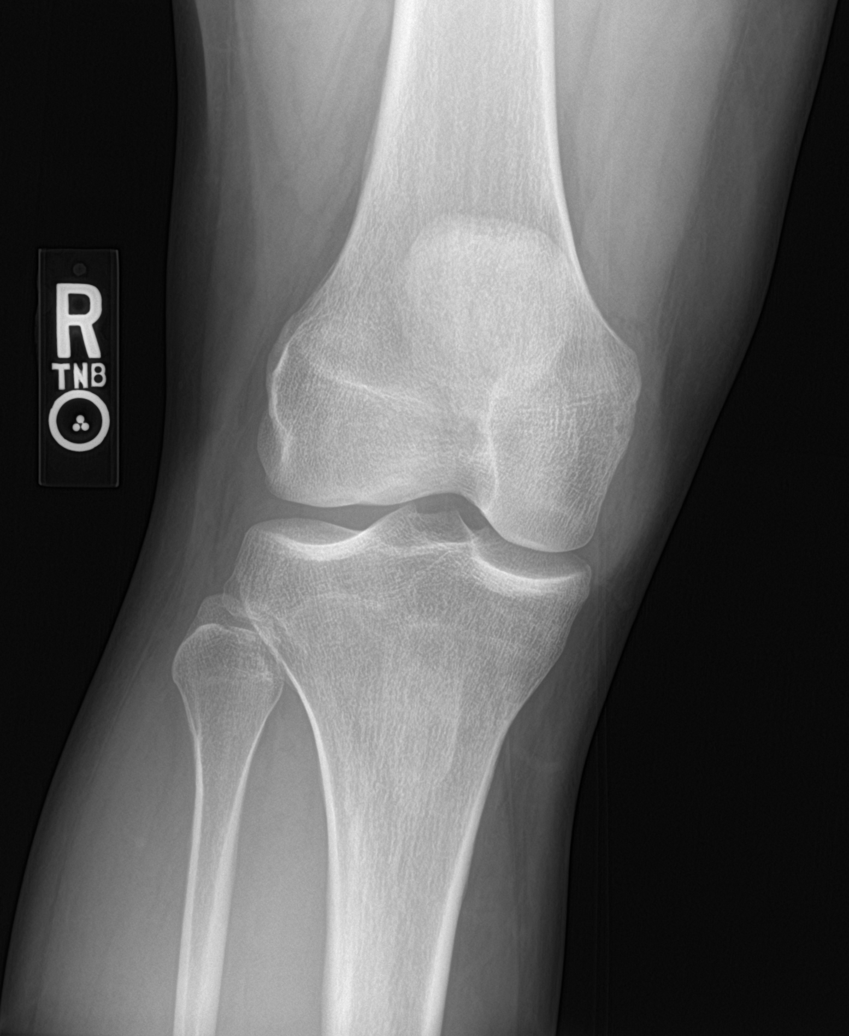

[knee obl (2 of 2)]
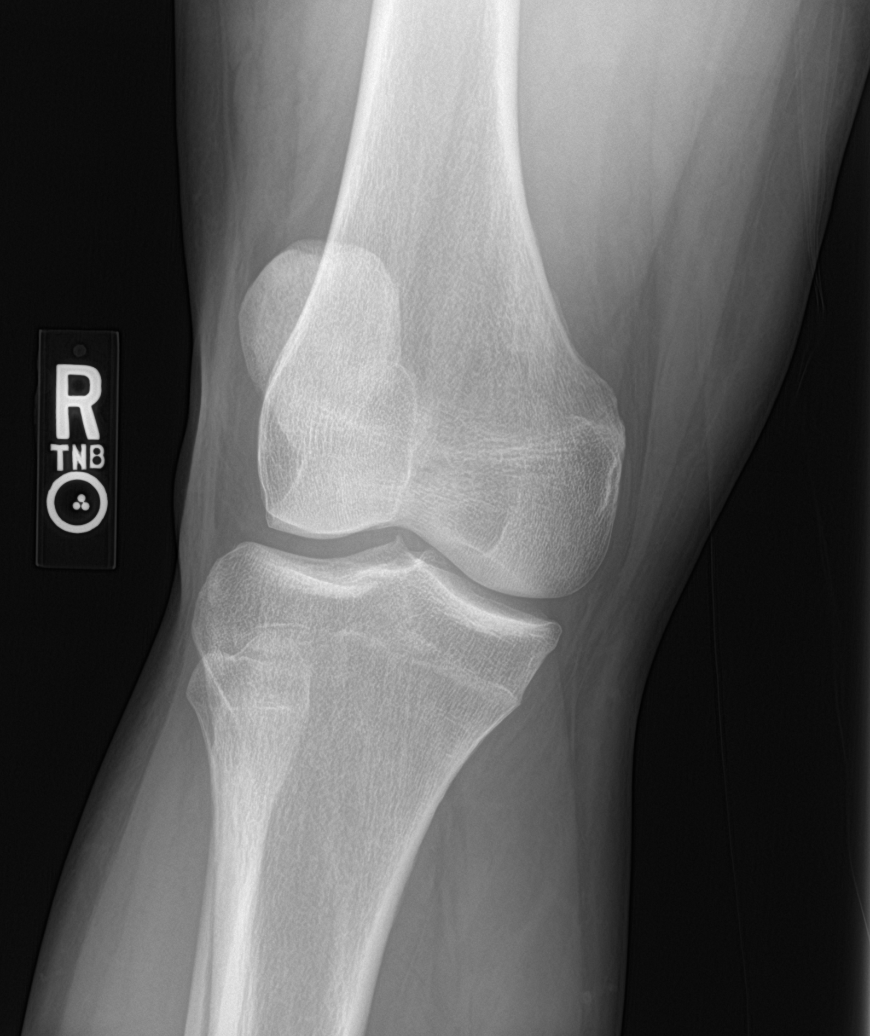

[knee lat]
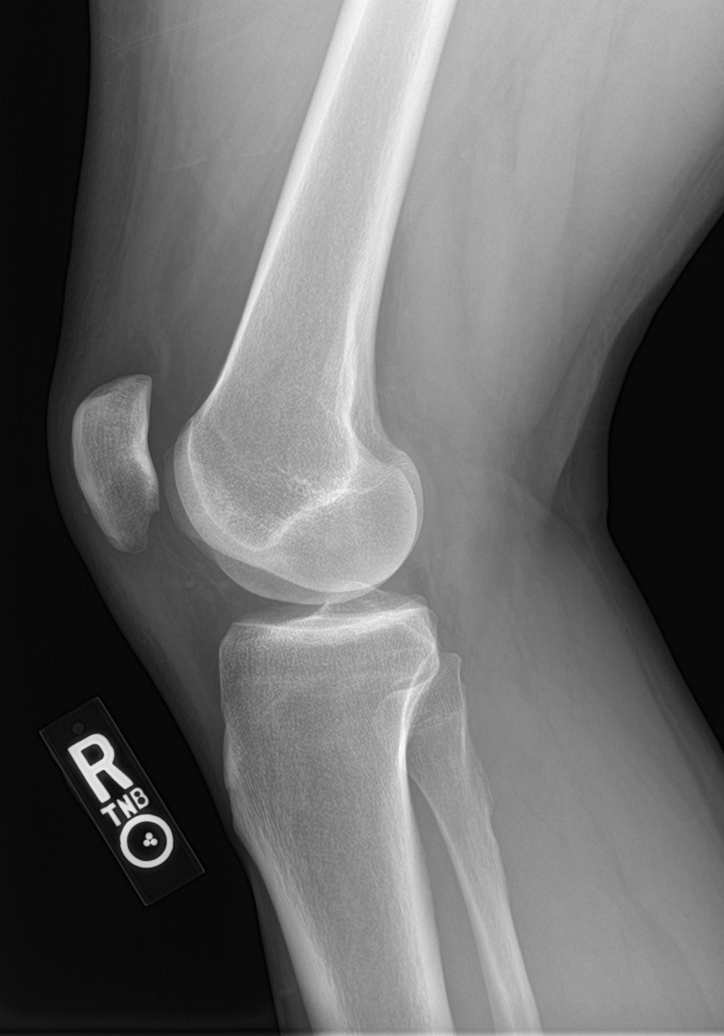

[knee sunrise]
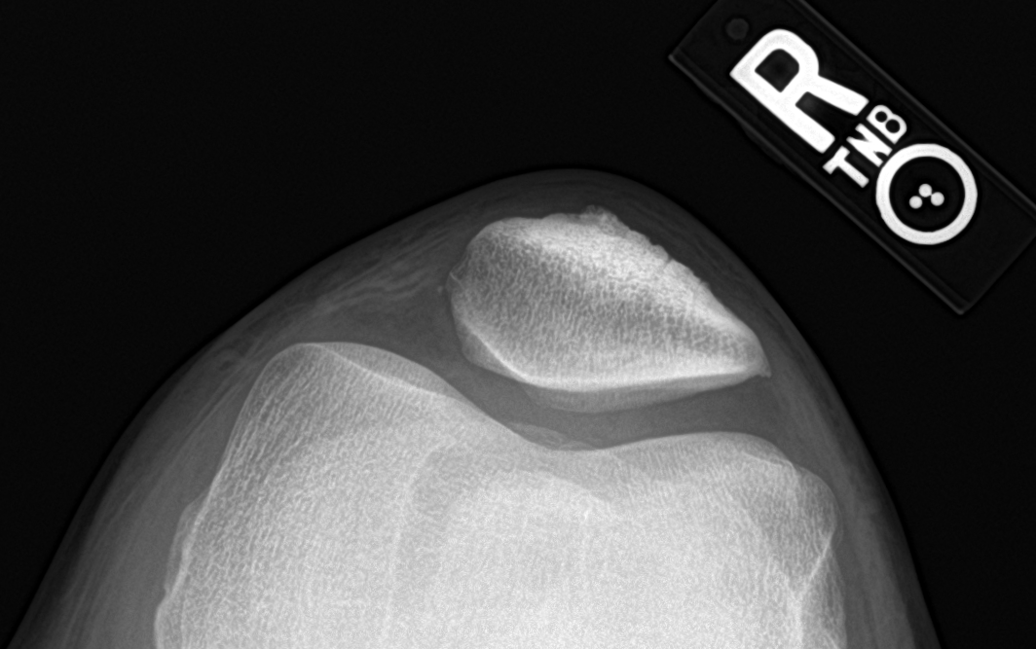

[5 of 5 positions shown; findings below may reference images not displayed]

FINDINGS: No evidence of fracture, dislocation, or joint effusion. No evidence
of arthropathy or other focal bone abnormality. Soft tissues are
unremarkable.
IMPRESSION: Negative.
